# Patient Record
Sex: Male | Born: 2000 | Race: White | Hispanic: No | Marital: Single | Smoking: Current every day smoker
Health system: Southern US, Community
[De-identification: ages and names within clinical notes are randomized; demographics above are authoritative.]

## PROBLEM LIST (undated history)

## (undated) DIAGNOSIS — I471 Supraventricular tachycardia: Secondary | ICD-10-CM

## (undated) DIAGNOSIS — J302 Other seasonal allergic rhinitis: Secondary | ICD-10-CM

## (undated) DIAGNOSIS — J45909 Unspecified asthma, uncomplicated: Secondary | ICD-10-CM

## (undated) HISTORY — PX: TYMPANOSTOMY TUBE PLACEMENT: SHX32

## (undated) HISTORY — PX: ADENOIDECTOMY: SUR15

---
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
## 2018-02-21 ENCOUNTER — Other Ambulatory Visit: Payer: Self-pay | Admitting: Internal Medicine

## 2018-02-25 ENCOUNTER — Other Ambulatory Visit: Payer: Self-pay | Admitting: Internal Medicine

## 2018-03-25 ENCOUNTER — Ambulatory Visit: Payer: BLUE CROSS/BLUE SHIELD | Admitting: Internal Medicine

## 2018-03-25 ENCOUNTER — Encounter: Payer: Self-pay | Admitting: Internal Medicine

## 2018-03-25 VITALS — BP 116/70 | HR 65 | Ht 73.0 in | Wt 145.0 lb

## 2018-03-25 DIAGNOSIS — R0602 Shortness of breath: Secondary | ICD-10-CM

## 2018-03-25 DIAGNOSIS — R059 Cough, unspecified: Secondary | ICD-10-CM

## 2018-03-25 DIAGNOSIS — I471 Supraventricular tachycardia: Secondary | ICD-10-CM | POA: Diagnosis not present

## 2018-03-25 DIAGNOSIS — R05 Cough: Secondary | ICD-10-CM

## 2018-03-25 NOTE — Patient Instructions (Addendum)
Medication Instructions:  Your physician recommends that you continue on your current medications as directed. Please refer to the Current Medication list given to you today.  Labwork: None ordered.  Testing/Procedures: A chest x-ray takes a picture of the organs and structures inside the chest, including the heart, lungs, and blood vessels. This test can show several things, including, whether the heart is enlarges; whether fluid is building up in the lungs; and whether pacemaker / defibrillator leads are still in place.  Follow-Up: Your physician wants you to follow-up in: one year with Benjamin Balsam, NP.   You will receive a reminder letter in the mail two months in advance. If you don't receive a letter, please call our office to schedule the follow-up appointment.   Any Other Special Instructions Will Be Listed Below (If Applicable).  If you need a refill on your cardiac medications before your next appointment, please call your pharmacy.

## 2018-03-25 NOTE — Progress Notes (Signed)
   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  Since last being seen in our clinic, the patient reports doing very well.  Today, he denies symptoms of palpitations, chest pain,  lower extremity edema, dizziness, presyncope, or syncope.  He vapes.  + cough with productive sputum x 3 weeks.  + SOB with moderate activity and wheezing (new).  The patient is otherwise without complaint today.   Past Medical History:  Diagnosis Date  . Asthma   . Seasonal allergies   . SVT (supraventricular tachycardia) (HCC) 09/02/2016   documented to be a short RP SVT on ekg (in media section of epic)   Past Surgical History:  Procedure Laterality Date  . ADENOIDECTOMY    . TYMPANOSTOMY TUBE PLACEMENT      ROS- all systems are reviewed and negatives except as per HPI above  Current Outpatient Medications  Medication Sig Dispense Refill  . cetirizine (ZYRTEC) 5 MG tablet Take 5 mg by mouth daily.    Marland Kitchen diltiazem (CARDIZEM CD) 120 MG 24 hr capsule Take 1 capsule (120 mg total) by mouth daily. 90 capsule 0  . esomeprazole (NEXIUM) 40 MG capsule Take 1 capsule by mouth daily.  1  . meloxicam (MOBIC) 15 MG tablet Take 1 tablet (15 mg total) by mouth daily. Take 1 daily with food. 10 tablet 0  . PROAIR HFA 108 (90 Base) MCG/ACT inhaler Inhale 1 puff into the lungs as needed for shortness of breath.  4   No current facility-administered medications for this visit.     Physical Exam: Vitals:   03/25/18 1621  BP: 116/70  Pulse: 65  SpO2: 98%  Weight: 145 lb (65.8 kg)  Height: 6\' 1"  (1.854 m)    GEN- The patient is well appearing, alert and oriented x 3 today.   Head- normocephalic, atraumatic Eyes-  Sclera clear, conjunctiva pink Ears- hearing intact Oropharynx- clear Lungs- Coarse BS at L base that does not improve with cough, + scattered wheeze, normal work of breathing Heart- Regular rate and rhythm,  no murmurs, rubs or gallops, PMI not laterally displaced GI- soft, NT, ND, + BS Extremities- no clubbing, cyanosis, or edema  Wt Readings from Last 3 Encounters:  03/25/18 145 lb (65.8 kg) (34 %, Z= -0.40)*  04/07/17 143 lb 11.2 oz (65.2 kg) (38 %, Z= -0.31)*  03/22/17 142 lb (64.4 kg) (35 %, Z= -0.38)*   * Growth percentiles are based on CDC (Boys, 2-20 Years) data.    EKG tracing ordered today is personally reviewed and shows sinus rhythm 65 bpm, PR 126 msec, QRS 92 msec, QTc 391 msec, repolarization abnormality  Assessment and Plan:  1. SVT Well controlled No concerns Return in a year  2. Cough/ SOB I have strongly discouraged vaping PA/ lateral CXR to further evaluate He is instructed to follow-up with his PCP if not improved in 1 week  Return to see EP NP in a year  Benjamin Range MD, [facility] 03/25/2018 4:53 PM

## 2018-03-26 ENCOUNTER — Ambulatory Visit
Admission: RE | Admit: 2018-03-26 | Discharge: 2018-03-26 | Disposition: A | Discharge status: Home / Self Care | Payer: BLUE CROSS/BLUE SHIELD | Source: Ambulatory Visit | Attending: Internal Medicine | Admitting: Internal Medicine

## 2018-03-26 DIAGNOSIS — R0602 Shortness of breath: Secondary | ICD-10-CM

## 2018-03-26 DIAGNOSIS — R05 Cough: Secondary | ICD-10-CM

## 2018-03-26 DIAGNOSIS — R059 Cough, unspecified: Secondary | ICD-10-CM

## 2018-05-18 ENCOUNTER — Telehealth: Payer: Self-pay | Admitting: Internal Medicine

## 2018-05-18 NOTE — Telephone Encounter (Signed)
Call returned to Pt's mother.  Advised per Dr. Johney FrameAllred ok for Pt to ride rollercoasters.  No further issues.

## 2018-05-18 NOTE — Telephone Encounter (Signed)
New Message   Pt's mother is calling, states they are needing to know if the pt has any restrictions to ride roller coasters. Please call

## 2018-05-24 ENCOUNTER — Other Ambulatory Visit: Payer: Self-pay | Admitting: Internal Medicine

## 2018-10-08 ENCOUNTER — Telehealth: Payer: Self-pay | Admitting: Internal Medicine

## 2018-10-08 NOTE — Telephone Encounter (Signed)
Diltiazem can cause fatigue and sluggish feeling if BP and HR are dropping low. However, pt has been taking the same dose of diltiazem 120mg  daily since April 2018 and vitals have been stable in office since then so it would be unusual for symptoms to present now if they were related to diltiazem. Acid reflux is not a side effect of diltiazem - pt already takes Nexium for this, diltiazem should not be contributing. Will need to defer to Dr Johney Frame for whether or not dose reduction of diltiazem is warranted.

## 2018-10-08 NOTE — Telephone Encounter (Signed)
Pt c/o medication issue:  1. Name of Medication:  diltiazem (CARDIZEM CD) 120 MG 24 hr capsule  2. How are you currently taking this medication (dosage and times per day)?  As prescribed  3. Are you having a reaction (difficulty breathing--STAT)?  Fatigue, sluggish, acid reflux   4. What is your medication issue? Mom wants to know what side effects would come from her son being on Diltiazem.  Mom wants to know if these are the side effects, if a lower dosage of the medication can be prescribed to ease the symptoms

## 2018-10-09 NOTE — Telephone Encounter (Signed)
Spoke with Pt's mother.  Advised diltiazem is not known to cause acid reflux.  Per  Mother Pt vapes heavily and has been advised that can cause all of his current symptoms.  Advised Dr. Johney Frame states Pt may try stopping diltiazem to see if fatigue improves.  Will need to restart if SVT returns.   Advised there was not a lower dose of diltiazem.  Mother indicates understanding.  Will advise son if and call office if appointment needed.

## 2018-10-22 ENCOUNTER — Telehealth: Payer: Self-pay | Admitting: Internal Medicine

## 2018-10-22 NOTE — Telephone Encounter (Signed)
Mother called on behalf of Pt.  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 Johney Frame aware of the situation.  She also wanted to know if the underlying esophagus condition may have lead to his heart issues and SVT symptoms. She also would like to know if her son should continue taking his medication.

## 2018-10-22 NOTE — Telephone Encounter (Signed)
Thanks for letting us know. I do not feel that his esophageal issues is related to his SVT.

## 2018-10-26 ENCOUNTER — Telehealth: Payer: Self-pay

## 2018-10-26 NOTE — Telephone Encounter (Signed)
Left detailed message for Pt mother.  Advised Dr. Johney Frame states esophageal issue and SVT not related.  If not related, stands to reason Pt should continue his medication for SVT.  Advised Pt mother to call back if any further questions.

## 2018-10-26 NOTE — Telephone Encounter (Signed)
error 

## 2019-04-23 ENCOUNTER — Telehealth: Payer: Self-pay | Admitting: Internal Medicine

## 2019-04-26 ENCOUNTER — Other Ambulatory Visit: Payer: Self-pay | Admitting: Internal Medicine

## 2019-05-20 ENCOUNTER — Other Ambulatory Visit: Payer: Self-pay | Admitting: Internal Medicine

## 2019-06-01 IMAGING — CR DG CHEST 2V
2 series · 2 of 2 positions shown · non-contrast
Comparison: None.

CLINICAL DATA: Cough and shortness of breath for 1 month. Vape
smoker.

EXAM:
CHEST - 2 VIEW

[w chest pa]
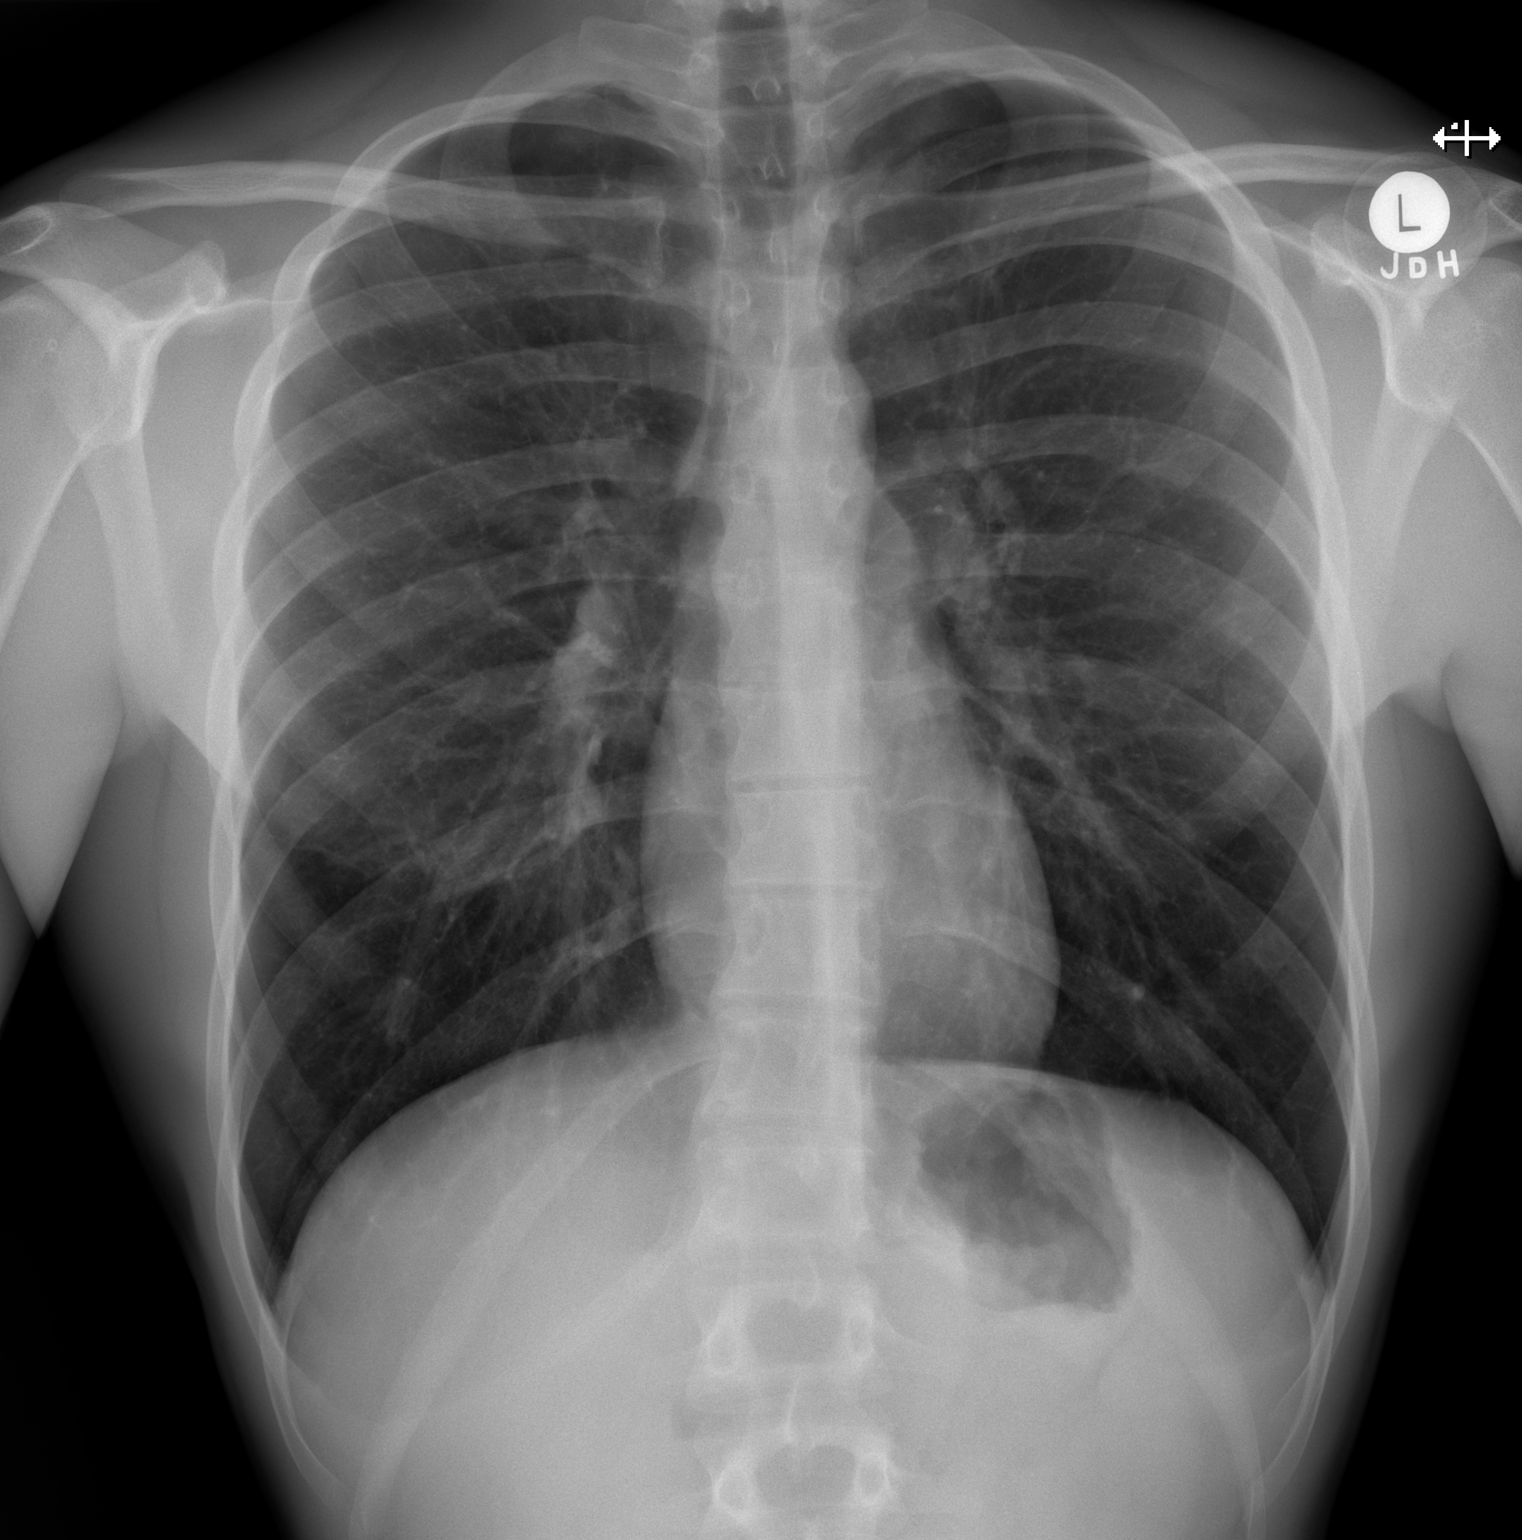

[w chest lat]
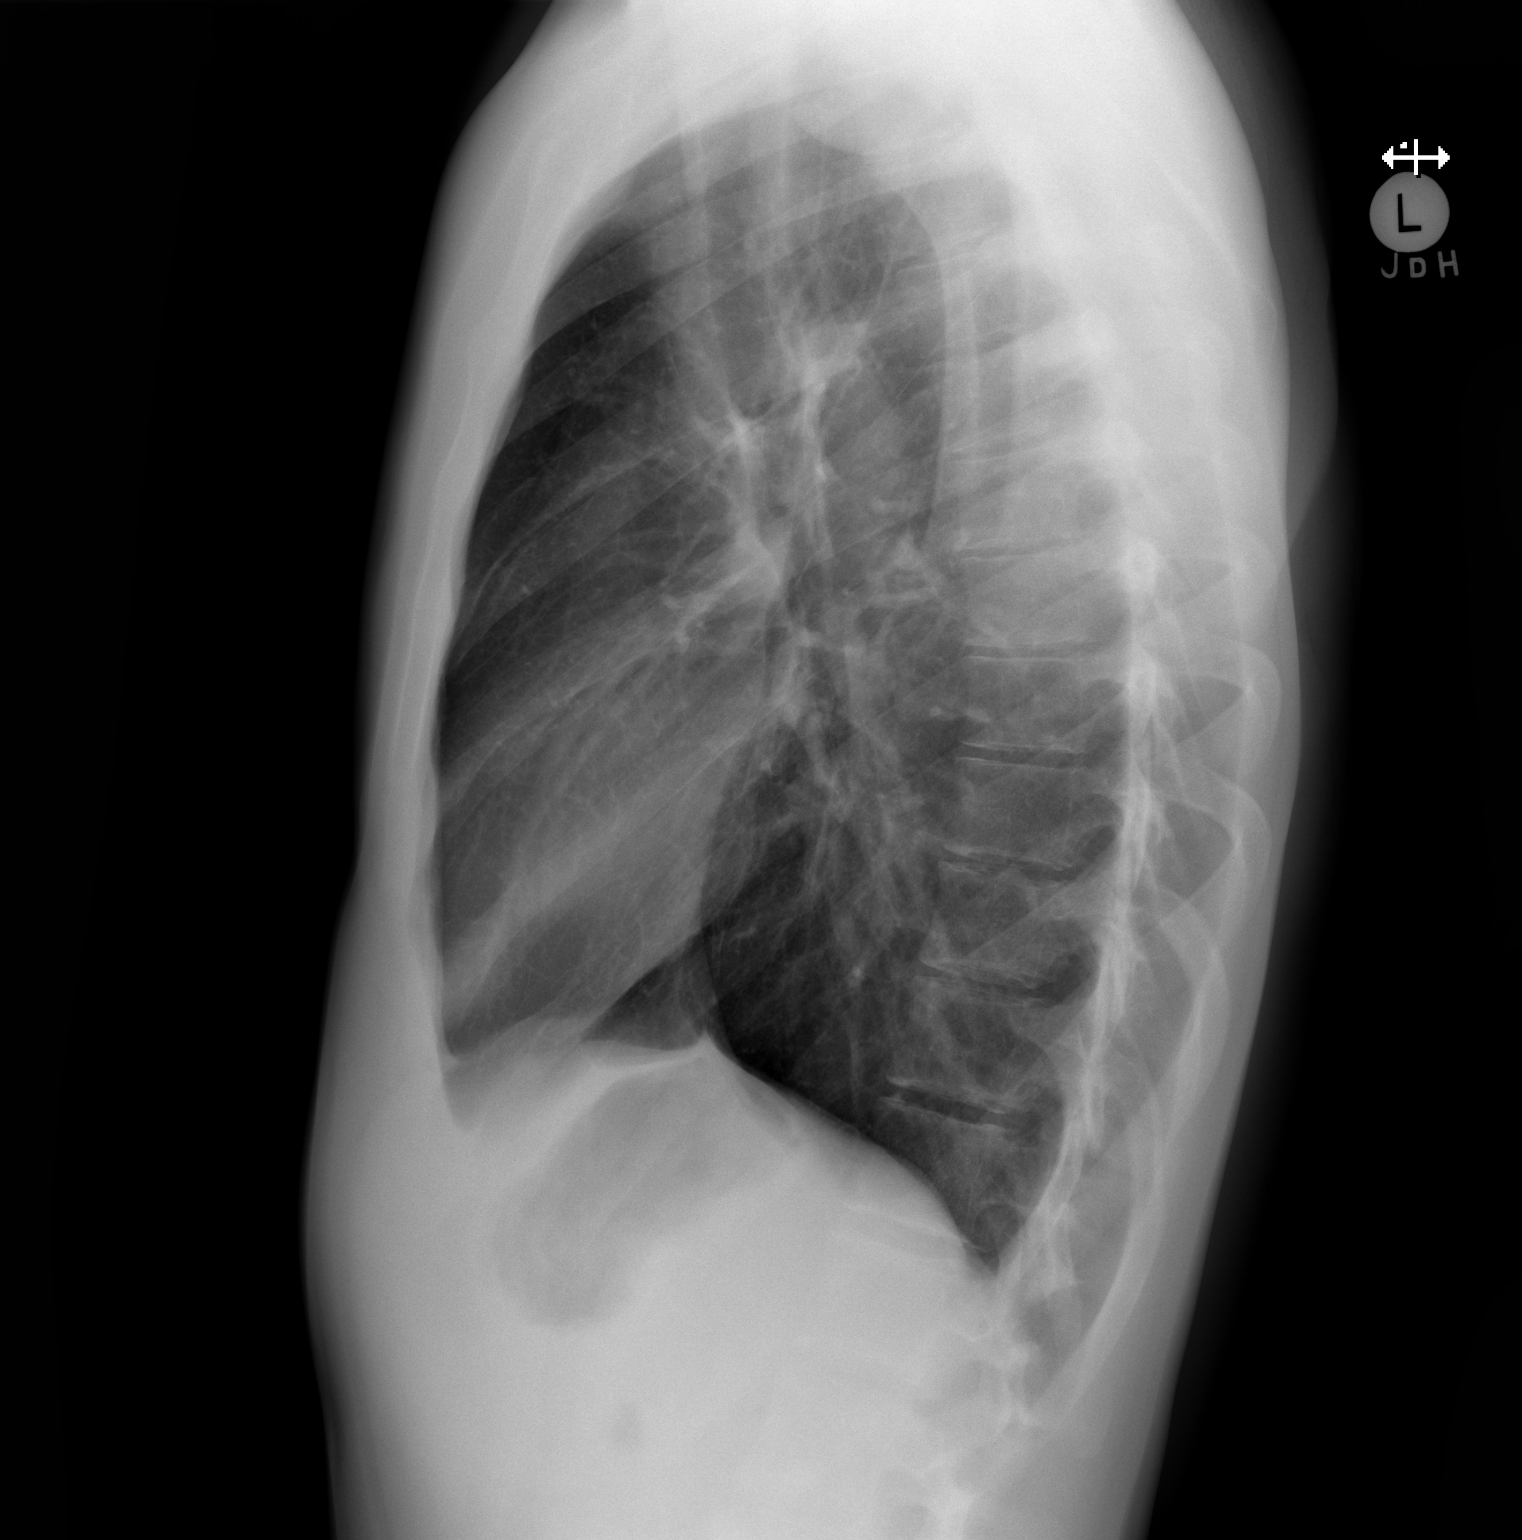

[2 of 2 positions shown; findings below may reference images not displayed]

FINDINGS: Normal heart, mediastinum and hila.

The lungs are clear.  No pleural effusion or pneumothorax.

Skeletal structures are unremarkable.
IMPRESSION: Normal chest radiographs.

## 2019-10-25 ENCOUNTER — Ambulatory Visit: Payer: BLUE CROSS/BLUE SHIELD | Attending: Internal Medicine

## 2019-10-25 DIAGNOSIS — Z23 Encounter for immunization: Secondary | ICD-10-CM

## 2019-10-25 NOTE — Progress Notes (Signed)
° °  Covid-19 Vaccination Clinic  Name:  Benjamin Bray    MRN: [id number] DOB: [birth year]  10/25/2019  Benjamin Bray was observed post Covid-19 immunization for 15 minutes without incident. He was provided with Vaccine Information Sheet and instruction to access the V-Safe system.   Benjamin Bray was instructed to call 911 with any severe reactions post vaccine:  Difficulty breathing   Swelling of face and throat   A fast heartbeat   A bad rash all over body   Dizziness and weakness   Immunizations Administered    Name Date Dose VIS Date Route   Pfizer COVID-19 Vaccine 10/25/2019  5:03 PM 0.3 mL 08/04/2018 Intramuscular   Manufacturer: [employer], [employer]   Lot: [id number]   NDC: [address]-6
